# Patient Record
Sex: Female | Born: 1960 | Race: White | Hispanic: No | Marital: Single
Health system: Midwestern US, Academic
[De-identification: ages and names within clinical notes are randomized; demographics above are authoritative.]

---

## 2017-01-27 ENCOUNTER — Ambulatory Visit: Admit: 2017-01-27 | Discharge: 2017-01-28

## 2017-01-27 ENCOUNTER — Encounter: Admit: 2017-01-27 | Discharge: 2017-01-27 | Payer: BC Managed Care – HMO | Primary: Family

## 2017-01-27 DIAGNOSIS — I25119 Atherosclerotic heart disease of native coronary artery with unspecified angina pectoris: Principal | ICD-10-CM

## 2017-01-27 DIAGNOSIS — R0602 Shortness of breath: ICD-10-CM

## 2019-01-27 ENCOUNTER — Encounter: Admit: 2019-01-27 | Discharge: 2019-01-27 | Primary: Family

## 2019-01-27 DIAGNOSIS — E113513 Type 2 diabetes mellitus with proliferative diabetic retinopathy with macular edema, bilateral: Principal | ICD-10-CM

## 2019-01-27 DIAGNOSIS — E119 Type 2 diabetes mellitus without complications: Secondary | ICD-10-CM

## 2019-01-27 MED ORDER — AFLIBERCEPT 2 MG/0.05 ML INTRAVITREAL SOLN
2 mg | Freq: Once | INTRAVITREAL | 0 refills | Status: CP
Start: 2019-01-27 — End: ?
  Administered 2019-01-27: 22:00:00 2 mg via INTRAVITREAL

## 2019-01-27 NOTE — Progress Notes
There is no height or weight on file to calculate BMI.       OCT:  OD: retinal cystic spaces, erm  OS:  retinal cystic spaces, erm       I discussed diagnosis and plan for intravitreal injection with patient. Risks, benefits and alternatives were discussed with patient. Risk of infection, and signs and symptoms of infection discussed at length with patient. Discussed fluctuating and deteriorating vision over the course of treatment. Patient elects to proceed with injection, informed consent was obtained and all questions were answered.     Injection procedure:    Site:     OD  x   OS     Posterior Sub-Tenon     Subconj     Intravitreous       Pre-injection drops:   Tetracaine 0.5% drops  x    5% Betadine  x    Betadine swab to lids  x    Speculum used     Other        Injection medication:    Concentration  Volume (in mL)    Aflibercept (Eylea)  2 mg / 0.05 mL  x   Ranibizumab (Lucentis)  0.3 mg / 0.05 mL     Ranibizumab (Lucentis)  0.5 mg / 0.05 mL     Bevacizumab (Avastin)  1.25 mg / 0.05 mL     Triamcinolone (Kenalog)  40 mg / mL     Triamcinolone (Triesence)  40 mg / mL     Vancomycin  1 mg / 100 microLiters     Ceftazidime  2 mg / 100 microLiters     Kenalog Triamcinolone 40mg /ml    Ozurdex Dexamethason 0.7mg  implant      Lot #  Expiration date:     Injection Needle:   27 Gauge Needle     30 Gauge Needle  x   Needle supplied with medication       Post-operative examination:   Central retinal artery perfused by vision check X   Intraocular pressure checked by tonopen and found to be ___ mmHg     Intraocular pressure checked by applanation and found to be ___ mmHg         I personally performed the Injection           Assessment and Plan:         DM2, on insulin. Last A1c: 9% (10/2018)    1-  PDR post prp OU  Stress importance of glycemic and systemic control.  Monitor      2- DME OU  ODL: juxtafoveal, plan for ivE  OS: non center involving     3- TRD OS  Stress importance of glycemic and systemic control. Plan for fill in prp    4- PCIOL OU  Monitor     Signs and symptoms of retinal tears and retinal detachment were reviewed in details with the patient. Patient was instructed to immediately present for evaluation or seek medical help if increased flashes, increased floaters, any decrease in vision, or curtain in field of vision.    F/u 4w or sooner prn  Oct, optos

## 2019-01-28 ENCOUNTER — Encounter: Admit: 2019-01-28 | Discharge: 2019-01-28 | Primary: Family

## 2019-01-28 ENCOUNTER — Ambulatory Visit: Admit: 2019-01-27 | Discharge: 2019-01-28 | Primary: Family

## 2019-01-28 DIAGNOSIS — H3342 Traction detachment of retina, left eye: Secondary | ICD-10-CM

## 2019-01-28 DIAGNOSIS — E113593 Type 2 diabetes mellitus with proliferative diabetic retinopathy without macular edema, bilateral: Secondary | ICD-10-CM

## 2019-01-28 DIAGNOSIS — E083592 Diabetes mellitus due to underlying condition with proliferative diabetic retinopathy without macular edema, left eye: Secondary | ICD-10-CM

## 2019-01-28 DIAGNOSIS — E113592 Type 2 diabetes mellitus with proliferative diabetic retinopathy without macular edema, left eye: Secondary | ICD-10-CM

## 2019-02-04 ENCOUNTER — Encounter: Admit: 2019-02-04 | Discharge: 2019-02-04 | Primary: Family

## 2019-02-04 ENCOUNTER — Ambulatory Visit: Admit: 2019-02-04 | Discharge: 2019-02-04 | Primary: Family

## 2019-02-04 DIAGNOSIS — E119 Type 2 diabetes mellitus without complications: Secondary | ICD-10-CM

## 2019-02-04 DIAGNOSIS — Z880 Allergy status to penicillin: Secondary | ICD-10-CM

## 2019-02-04 DIAGNOSIS — Z9841 Cataract extraction status, right eye: Secondary | ICD-10-CM

## 2019-02-04 MED ORDER — TROPICAMIDE 1 % OP DROP
1 [drp] | 0 refills | Status: CP
Start: 2019-02-04 — End: ?
  Administered 2019-02-04: 17:00:00 1 [drp]

## 2019-02-04 MED ORDER — TETRACAINE HCL (PF) 0.5 % OP DROP
0 refills | Status: DC
Start: 2019-02-04 — End: 2019-02-04
  Administered 2019-02-04: 18:00:00 1 [drp] via OPHTHALMIC

## 2019-02-04 MED ORDER — PHENYLEPHRINE HCL 2.5 % OP DROP
1 [drp] | 0 refills | Status: CP
Start: 2019-02-04 — End: ?
  Administered 2019-02-04: 17:00:00 1 [drp]

## 2019-02-04 MED ORDER — CYCLOPENTOLATE 1 % OP DROP
1 [drp] | 0 refills | Status: CP
Start: 2019-02-04 — End: ?
  Administered 2019-02-04: 17:00:00 1 [drp]

## 2019-02-04 NOTE — Care Plan
Patient denies pain. Discharge instructions went over with patient.Verbalizes understanding.  Patient denies questions or concerns at this time

## 2019-02-04 NOTE — Discharge Instructions - Supplementary Instructions
[name]                            LASER  POST-OPERATIVE  INSTRUCTIONS    HOME  INSTRUCTIONS  ? Continue all the prescribed medication in your non-operative eye as usual  ? No eye patch or shield is needed  ? Resume all of your home medications today, unless instructed otherwise by your doctor  ? Resume all normal activities  ? Additional Instructions_________________________________________________________  ____________________________________________________________________________                 WHAT  TO  EXPECT  AFTER  SURGERY:  ? Your eye may feel irritated, scratchy, or like something is in it  ? Your eye may appear red or bloodshot, this will lessen as your eye heals  ? Your vision may be blurry and eyes sensitive to light following the procedure    WHAT TO WATCH / REPORT  FOR  AFTER  SURGERY:  ? Sudden decrease in vision  ? New or an increase in flashes of light or floaters  ? Sudden increase of pain or pain not relieved by over the counter pain medication                     Call the [facility] at [phone] or         at [phone] (even after hours) for questions, problems or concerns                                                                                                 Patient Signature________________________________Date_________Time____________        Nurse Signature____________________________________

## 2019-02-04 NOTE — H&P (View-Only)
Ophthalmology Preoperative History and Physical Exam - @MYYEAR @    CC/Reason for Surgery: 1-  TRD OS  2- PDR OS    HPI:   1-  TRD OS  2- PDR OS    Past Medical History:  Medical History:   Diagnosis Date   ??? DM (diabetes mellitus) (HCC)         Past Surgical History:  Surgical History:   Procedure Laterality Date   ??? CARDIAC SURGERY     ??? HX CATARACT REMOVAL Right    ??? HX HEART CATHETERIZATION          Past Ocular History:  PDR OU    Allergies:  Allergies   Allergen Reactions   ??? Penicillins UNKNOWN     As a child.        Social History:  Social History     Socioeconomic History   ??? Marital status: Single     Spouse name: Not on file   ??? Number of children: Not on file   ??? Years of education: Not on file   ??? Highest education level: Not on file   Occupational History   ??? Not on file   Tobacco Use   ??? Smoking status: Never Smoker   ??? Smokeless tobacco: Never Used   Substance and Sexual Activity   ??? Alcohol use: Not on file   ??? Drug use: Not on file   ??? Sexual activity: Not on file   Other Topics Concern   ??? Not on file   Social History Narrative   ??? Not on file        Medications:  No current facility-administered medications for this encounter.      Family History:  Family History   Problem Relation Age of Onset   ??? Blindness Neg Hx    ??? Glaucoma Neg Hx    ??? Macular Degen Neg Hx         ROS:   Constitutional: WNL   Eyes: See HPI   Ears: WNL   CV: WNL   Resp: WNL   Gastro: WNL   Musculo: WNL   Skin: WNL   Neuro: WNL     Physical Exam:  See nursing intake for vitals    General: No acute distress  HEENT: NC/AT  CV: RRR.  No murmurs/rubs/gallops detected  Resp: CTA Bilaterally  Musculoskeletal: WNL, able to lay flat    Lab Results:  CBC w/Diff    No results found for: WBC, RBC, HGB, HCT, MCV, MCH, MCHC, RDW, PLTCT, MPV No results found for: NEUT, ANC, LYMA, ALC, MONA, AMC, EOSA, AEC, BASA, ABC        Assessment:  1-  TRD OS  2- PDR OS      Plan: To laser room for PRP laser prophylaxis OS An extensive discussion took place with the patient concerning the risks, benefits and alternatives to the above procedure. The patient was given the opportunity to have all questions answered. At the conclusion of our discussion, signed informed consent was obtained.     Ronnald Collum, MBBCh   Retina and Vitreous Surgery  Department of Ophthalmology  [facility]      [facility]  [address] [place], [place] [address]  Ph:  [phone]

## 2019-02-04 NOTE — Operative Report(Direct Entry)
OPERATIVE REPORT    Name: Achol Azpeitia is a 58 y.o. 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     Specimen(s) Removed/Disposition: * No specimens in log *    Attestation: I performed this procedure without the involvement of a resident.    Complications:  None    Implants: None    Drains: None    Disposition:  PACU - stable    Nataliah Hatlestad, MBBCh  Pager

## 2019-02-06 ENCOUNTER — Encounter: Admit: 2019-02-06 | Discharge: 2019-02-06 | Primary: Family

## 2019-02-06 DIAGNOSIS — E119 Type 2 diabetes mellitus without complications: Secondary | ICD-10-CM

## 2019-02-25 ENCOUNTER — Encounter: Admit: 2019-02-25 | Discharge: 2019-02-25 | Primary: Family

## 2019-02-25 DIAGNOSIS — E113513 Type 2 diabetes mellitus with proliferative diabetic retinopathy with macular edema, bilateral: Principal | ICD-10-CM

## 2019-02-25 MED ORDER — AFLIBERCEPT 2 MG/0.05 ML INTRAVITREAL SOLN
2 mg | Freq: Once | INTRAVITREAL | 0 refills | Status: CP
Start: 2019-02-25 — End: ?
  Administered 2019-02-25: 15:00:00 2 mg via INTRAVITREAL

## 2019-02-25 NOTE — Progress Notes
There is no height or weight on file to calculate BMI.       OCT:  OD: mild increase retinal cystic spaces, erm  OS: retinal cystic spaces, erm       I discussed diagnosis and plan for intravitreal injection with patient. Risks, benefits and alternatives were discussed with patient. Risk of infection, and signs and symptoms of infection discussed at length with patient. Discussed fluctuating and deteriorating vision over the course of treatment. Patient elects to proceed with injection, informed consent was obtained and all questions were answered.     Injection procedure:    Site:     OD  x   OS     Posterior Sub-Tenon     Subconj     Intravitreous       Pre-injection drops:   Tetracaine 0.5% drops  x    5% Betadine  x    Betadine swab to lids  x    Speculum used     Other        Injection medication:    Concentration  Volume (in mL)    Aflibercept (Eylea)  2 mg / 0.05 mL  x   Ranibizumab (Lucentis)  0.3 mg / 0.05 mL     Ranibizumab (Lucentis)  0.5 mg / 0.05 mL     Bevacizumab (Avastin)  1.25 mg / 0.05 mL     Triamcinolone (Kenalog)  40 mg / mL     Triamcinolone (Triesence)  40 mg / mL     Vancomycin  1 mg / 100 microLiters     Ceftazidime  2 mg / 100 microLiters     Kenalog Triamcinolone 40mg /ml    Ozurdex Dexamethason 0.7mg  implant      Lot #  Expiration date:     Injection Needle:   27 Gauge Needle     30 Gauge Needle  x   Needle supplied with medication       Post-operative examination:   Central retinal artery perfused by vision check X   Intraocular pressure checked by tonopen and found to be ___ mmHg     Intraocular pressure checked by applanation and found to be ___ mmHg         I personally performed the Injection           Assessment and Plan:         DM2, on insulin. Last A1c: 9% (10/2018)  ???  1-  PDR post prp OU  Stress importance of glycemic and systemic control.  Monitor    ???  2- DME OU  ODL: juxtafoveal, plan for ivE  OS: non center involving   ???  3- TRD OS Stress importance of glycemic and systemic control.  Plan for fill in prp  ???  4- PCIOL OU  Monitor     5- ERM OU  Asymptomatic   monitor   ???  Signs and symptoms of retinal tears and retinal detachment were reviewed in details with the patient. Patient was instructed to immediately present for evaluation or seek medical help if increased flashes, increased floaters, any decrease in vision, or curtain in field of vision.  ???  F/u 4w or sooner prn  Oct, optos  ???

## 2019-02-26 ENCOUNTER — Ambulatory Visit: Admit: 2019-02-25 | Discharge: 2019-02-26 | Primary: Family

## 2019-03-03 ENCOUNTER — Encounter: Admit: 2019-03-03 | Discharge: 2019-03-03 | Primary: Family

## 2019-03-03 NOTE — Telephone Encounter
03/03/19 called and left message for pt to call back with records information. Need previous cardiac care info and verify pcp info edh

## 2019-03-11 ENCOUNTER — Encounter: Admit: 2019-03-11 | Discharge: 2019-03-11 | Primary: Family

## 2019-03-17 ENCOUNTER — Encounter: Admit: 2019-03-17 | Discharge: 2019-03-17 | Primary: Family

## 2019-03-17 ENCOUNTER — Ambulatory Visit: Admit: 2019-03-17 | Discharge: 2019-03-18 | Primary: Family

## 2019-03-17 DIAGNOSIS — E119 Type 2 diabetes mellitus without complications: Secondary | ICD-10-CM

## 2019-03-17 DIAGNOSIS — I5042 Chronic combined systolic (congestive) and diastolic (congestive) heart failure: Secondary | ICD-10-CM

## 2019-03-17 DIAGNOSIS — E785 Hyperlipidemia, unspecified: Secondary | ICD-10-CM

## 2019-03-17 DIAGNOSIS — I2510 Atherosclerotic heart disease of native coronary artery without angina pectoris: Secondary | ICD-10-CM

## 2019-03-17 DIAGNOSIS — Z9889 Other specified postprocedural states: Secondary | ICD-10-CM

## 2019-03-17 DIAGNOSIS — I509 Heart failure, unspecified: Secondary | ICD-10-CM

## 2019-03-17 DIAGNOSIS — I5032 Chronic diastolic (congestive) heart failure: Secondary | ICD-10-CM

## 2019-03-17 DIAGNOSIS — I255 Ischemic cardiomyopathy: Secondary | ICD-10-CM

## 2019-03-17 NOTE — Assessment & Plan Note
Left ventricular systolic function improved to an ejection fraction of 60% by echocardiography in March.  She does not currently have symptoms heart failure.

## 2019-03-17 NOTE — Progress Notes
Records Request  STAT REQUEST FOR MOST RECENT LABS    Medical records request for continuation of care:    Patient has appointment on Now with Dr. Ricard Dillon.     Please fax records to [facility]  [phone]    Request records:        Recent Labs        Other      Thank you,      [facility]  [facility]  [address] [place] [address]  Phone:  [phone]  Fax:  [phone]

## 2019-03-17 NOTE — Assessment & Plan Note
Breathlessness is her cardiac ischemia symptoms.  She is doing well from a symptom standpoint currently.  She does not need to be on Plavix because she did not get a coronary stent during the catheterization in March.

## 2019-03-17 NOTE — Assessment & Plan Note
I requested her most lipid levels.

## 2019-03-17 NOTE — Progress Notes
Date of Service: 03/17/2019    Debra Kennedy is a 58 y.o. female.       HPI     [facility]ra [facility] was in the [facility] today to establish cardiology care.  She lives in [place] him and has [place].  She has been seeing Dr. Wyvonnia Lora in the [facility] but because he is based out of [place] he suggested that she transfer her care to the [facility] in the eventuality that she requires hospitalization.    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  She has been off work for about 6 months now and is looking forward to getting back to work later this month.    She was diagnosed with type 1 diabetes about 10 years ago.  She presented with breathlessness in 2017 and was found to have multivessel coronary disease which led to a bypass [facility]  She had a vein graft to the circumflex and left internal mammary to the LAD.  Initially she had severe left ventricular systolic dysfunction but with medical therapy she has had steady improvement and her most recent ejection fraction by echocardiography was in March, 55 to 60%.    She is never had typical angina symptoms and this remains the case.  In March she had problems with progressive breathlessness and underwent coronary arteriography at [facility].  At that time the vein graft to the circumflex was found to be occluded and an attempt to perform PCI on a totally occluded circumflex was ultimately unsuccessful.  She was discharged on Plavix in addition to aspirin at that point.    She is done pretty well since then.  She says that her blood pressure has been good, her weight has been stable, and she has not had any particular problems with exertional breathlessness.    She sees her nurse practitioner her primary provider at the [facility] on a monthly basis.  She says that her most recent hemoglobin A1c was down to 7.3%.  She thinks that she had a lipid profile recently and says that she has been on atorvastatin for about 3 years. She denies any problems with palpitations, syncope, or near syncope.  She denies TIA or stroke symptoms.       Vitals:    03/17/19 1338 03/17/19 1359   BP: 116/82 114/72   BP Source: Arm, Left Upper Arm, Right Upper   Pulse: 88    Temp: 36.7 ???C (98 ???F)    SpO2: 98%    Weight: 101.6 kg (224 lb)    Height: 1.6 m (5' 3)    PainSc: Zero      Body mass index is 39.68 kg/m???.     Past Medical History  Patient Active Problem List    Diagnosis Date Noted   ??? Coronary artery disease 03/17/2019     11/09/2018  Coronary angiogram:  Balloon angioplasty of the proximal and mid to distal left circumflex into a small obtuse marginal branch without any significant improvement in flow in the obtuse marginal branch.  Occluded LAD, occluded RCA.  Saphenous vein graft to obtuse marginal branches is occluded.  Widely patent LIMA to the LAD.     03/11/2016 [facility] CABG x 2 vessels           ??? Ischemic cardiomyopathy 03/17/2019     10/21/2018  Echo:  Normal left ventricular size.  LV EF 55-60%.  Normal LV wall thickness. LV diastolic parameters were normal.  There is mild to mod mitral valve regurgitation.  Mild mitral annular calcification.  Mitral valve leaflets appear mildly thickened.  Tricuspid valve not well visualized.  Right ventricular systolic pressure could not be determined due to the lack of a tricuspid regurgitation doppler signal. No significant pericardial effusion.  The IVC diameter was 11 mm. The inferior vena cava shows a normal respiratory collapse consistent with normal right atrial pressure ( ).  05/21/2017  Echo: Normal left ventricular size.  Mod depressed left ventricular systolic function.  EF 40%.  Normal LV wall thickness.  Normal pericardium.  IVC diameter was 13 mm.        ??? Congestive heart failure (HCC) 03/17/2019   ??? Dyslipidemia 03/17/2019   ??? Diabetes mellitus (HCC) 03/17/2019   ??? 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   HENT: Negative.    Eyes: Negative.    Cardiovascular: Positive for dyspnea on exertion.   Respiratory: Positive for cough.    Endocrine: Negative.    Hematologic/Lymphatic: Bruises/bleeds easily.   Skin: Negative.    Musculoskeletal: Positive for back pain.   Gastrointestinal: Negative.    Genitourinary: Negative.    Neurological: Negative.    Psychiatric/Behavioral: The patient has insomnia.    Allergic/Immunologic: Negative.        Physical Exam    Physical Exam   General Appearance: no distress   Skin: warm, no ulcers or xanthomas   Digits and Nails: no cyanosis or clubbing   Eyes: conjunctivae and lids normal, pupils are equal and round   Teeth/Gums/Palate: dentition unremarkable, no lesions   Lips & Oral Mucosa: no pallor or cyanosis   Neck Veins: normal JVP , neck veins are not distended   Thyroid: no nodules, masses, tenderness or enlargement   Chest Inspection: chest is normal in appearance   Respiratory Effort: breathing comfortably, no respiratory distress   Auscultation/Percussion: lungs clear to auscultation, no rales or rhonchi, no wheezing   PMI: PMI not enlarged or displaced   Cardiac Rhythm: regular rhythm and normal rate   Cardiac Auscultation: S1, S2 normal, no rub, no gallop   Murmurs: no murmur   Peripheral Circulation: normal peripheral circulation   Carotid Arteries: normal carotid upstroke bilaterally, no bruits   Radial Arteries: normal symmetric radial pulses   Abdominal Aorta: no abdominal aortic bruit   Pedal Pulses: normal symmetric pedal pulses   Lower Extremity Edema: no lower extremity edema   Abdominal Exam: soft, non-tender, no masses, bowel sounds normal   Liver & Spleen: no organomegaly   Gait & Station: walks without assistance   Muscle Strength: normal muscle tone   Orientation: oriented to time, place and person   Affect & Mood: appropriate and sustained affect   Language and Memory: patient responsive and seems to comprehend information Neurologic Exam: neurological assessment grossly intact   Other: moves all extremities      Cardiovascular Studies    EKG:  Sinus rhythm, rate 82.  Normal EKG.    Problems Addressed Today  Encounter Diagnoses   Name Primary?   ??? Chronic diastolic heart failure (HCC) Yes   ??? Status post ligation of left atrial appendage    ??? Coronary artery disease due to calcified coronary lesion    ??? Chronic combined systolic and diastolic congestive heart failure (HCC)    ??? Dyslipidemia  Assessment and Plan       Coronary artery disease  Breathlessness is her cardiac ischemia symptoms.  She is doing well from a symptom standpoint currently.  She does not need to be on Plavix because she did not get a coronary stent during the catheterization in March.    Congestive heart failure (HCC)  Left ventricular systolic function improved to an ejection fraction of 60% by echocardiography in March.  She does not currently have symptoms heart failure.    Dyslipidemia  I requested her most lipid levels.      Current Medications (including today's revisions)  ??? aspirin EC 81 mg tablet Take 1 tablet by mouth daily.   ??? atorvastatin (LIPITOR) 80 mg tablet Take 1 tablet by mouth daily.   ??? fluticasone propionate (FLONASE) 50 mcg/actuation nasal spray, suspension Apply 1 spray to each nostril as directed as Needed.   ??? furosemide (LASIX) 20 mg tablet Take 1 tablet by mouth every 48 hours. Alternating with 40mg  every other day   ??? furosemide (LASIX) 40 mg tablet ALTERNATE BETWEEN 1 TABLET AND 1 2 TABLET BY MOUTH EVERY OTHER DAY   ??? glipiZIDE CR (GLUCOTROL XL) 10 mg tablet Take 10 mg by mouth daily with breakfast.   ??? insulin degludec (TRESIBA FLEXTOUCH U-100) 100 unit/mL (3 mL) injection pen Inject 30 Units under the skin every morning.   ??? isosorbide mononitrate SR (IMDUR) 30 mg tablet Take 30 mg by mouth daily.   ??? JARDIANCE 25 mg tablet Take 25 mg by mouth daily.   ??? lisinopriL (ZESTRIL) 5 mg tablet Take 5 mg by mouth daily. ??? melatonin 10 mg tab Take 1 tablet by mouth at bedtime as needed.   ??? metFORMIN (GLUCOPHAGE) 1,000 mg tablet Take 1,000 mg by mouth twice daily with meals.   ??? metoprolol XL (TOPROL XL) 50 mg extended release tablet Take 1 tablet by mouth daily.   ??? nitroglycerin (NITROSTAT) 0.4 mg tablet DISSOLVE ONE TABLET UNDER THE TONGUE EVERY 5 MINUTES AS NEEDED FOR CHEST PAIN. CALL 911. DO NOT EXCEED A TOTAL OF 3 DOSES IN 15 MINUTES NOW   ??? omeprazole DR (PRILOSEC) 20 mg capsule Take 20 mg by mouth daily before breakfast.   ??? potassium chloride SR (K-DUR) 20 mEq tablet Take 20 mEq by mouth daily.   ??? PROAIR RESPICLICK 90 mcg/actuation aerosol inhaler INHALE 1 PUFF BY MOUTH EVERY 4 TO 6 HOURS AS NEEDED

## 2019-06-15 ENCOUNTER — Encounter: Admit: 2019-06-15 | Discharge: 2019-06-15 | Payer: BC Managed Care – HMO | Primary: Family

## 2019-06-15 NOTE — Progress Notes
Records Request    Medical records request for continuation of care:    Patient has appointment on 06/21/2019  with  Dr. Tyson Alias* .    Please fax records to [facility]    Request records: STAT          Recent Labs (Most recent CMP, Lipid Panel)            Thank you,      Cardiovascular Medicine  [facility]  [address] [place] [address]  Phone:  [phone]  Fax:  [phone]

## 2019-06-21 ENCOUNTER — Encounter: Admit: 2019-06-21 | Discharge: 2019-06-21 | Payer: BC Managed Care – HMO | Primary: Family

## 2019-06-21 DIAGNOSIS — I2510 Atherosclerotic heart disease of native coronary artery without angina pectoris: Secondary | ICD-10-CM

## 2019-06-21 DIAGNOSIS — E1059 Type 1 diabetes mellitus with other circulatory complications: Secondary | ICD-10-CM

## 2019-06-21 DIAGNOSIS — E785 Hyperlipidemia, unspecified: Secondary | ICD-10-CM

## 2019-06-21 DIAGNOSIS — I255 Ischemic cardiomyopathy: Secondary | ICD-10-CM

## 2019-06-21 NOTE — Progress Notes
Date of Service: 06/21/2019    Debra Kennedy is a 58 y.o. female.       HPI     Debra Kennedy was in the [place] [facility] today for follow-up regarding coronary disease.  I had initially seen her in August.  She is done reasonably well over the past couple of months and is very happy with the gradual decline in her hemoglobin A1c levels.    She has had no trouble with palpitations, syncope, or near syncope.  She denies any anginal chest discomfort or exertional dyspnea.         Vitals:    06/21/19 1541 06/21/19 1554   BP: 128/84 124/78   BP Source: Arm, Left Upper Arm, Right Upper   Pulse: 74    Temp: 36.7 ?C (98.1 ?F)    SpO2: 98%    Weight: 99.6 kg (219 lb 9.6 oz)    Height: 1.6 m (5' 3)    PainSc: Zero      Body mass index is 38.9 kg/m?Marland Kitchen     Past Medical History  Patient Active Problem List    Diagnosis Date Noted   ? Coronary artery disease 03/17/2019     11/09/2018  Coronary angiogram:  Balloon angioplasty of the proximal and mid to distal left circumflex into a small obtuse marginal branch without any significant improvement in flow in the obtuse marginal branch.  Occluded LAD, occluded RCA.  Saphenous vein graft to obtuse marginal branches is occluded.  Widely patent LIMA to the LAD.     03/11/2016 [facility] CABG x 2 vessels           ? Ischemic cardiomyopathy 03/17/2019     10/21/2018  Echo:  Normal left ventricular size.  LV EF 55-60%.  Normal LV wall thickness. LV diastolic parameters were normal.  There is mild to mod mitral valve regurgitation.  Mild mitral annular calcification.  Mitral valve leaflets appear mildly thickened.  Tricuspid valve not well visualized.  Right ventricular systolic pressure could not be determined due to the lack of a tricuspid regurgitation doppler signal. No significant pericardial effusion.  The IVC diameter was 11 mm. The inferior vena cava shows a normal respiratory collapse consistent with normal right atrial pressure ( ). 05/21/2017  Echo: Normal left ventricular size.  Mod depressed left ventricular systolic function.  EF 40%.  Normal LV wall thickness.  Normal pericardium.  IVC diameter was 13 mm.        ? Congestive heart failure (HCC) 03/17/2019   ? Dyslipidemia 03/17/2019   ? Diabetes mellitus (HCC) 03/17/2019   ? 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   HENT: Negative.    Eyes: Negative.    Cardiovascular: Positive for dyspnea on exertion.   Respiratory: Positive for snoring.    Endocrine: Negative.    Hematologic/Lymphatic: Negative.    Skin: Negative.    Musculoskeletal: Negative.    Gastrointestinal: Negative.    Genitourinary: Negative.    Neurological: Positive for light-headedness, numbness and paresthesias.   Psychiatric/Behavioral: Positive for depression. The patient is nervous/anxious.    Allergic/Immunologic: Negative.        Physical Exam    Physical Exam   General Appearance: no distress   Skin: warm, no ulcers or xanthomas   Digits and Nails: no cyanosis or clubbing   Eyes: conjunctivae and lids normal,  pupils are equal and round   Teeth/Gums/Palate: dentition unremarkable, no lesions   Lips & Oral Mucosa: no pallor or cyanosis   Neck Veins: normal JVP , neck veins are not distended   Thyroid: no nodules, masses, tenderness or enlargement   Chest Inspection: chest is normal in appearance   Respiratory Effort: breathing comfortably, no respiratory distress   Auscultation/Percussion: lungs clear to auscultation, no rales or rhonchi, no wheezing   PMI: PMI not enlarged or displaced   Cardiac Rhythm: regular rhythm and normal rate   Cardiac Auscultation: S1, S2 normal, no rub, no gallop   Murmurs: no murmur   Peripheral Circulation: normal peripheral circulation   Carotid Arteries: normal carotid upstroke bilaterally, no bruits   Radial Arteries: normal symmetric radial pulses Abdominal Aorta: no abdominal aortic bruit   Pedal Pulses: normal symmetric pedal pulses   Lower Extremity Edema: no lower extremity edema   Abdominal Exam: soft, non-tender, no masses, bowel sounds normal   Liver & Spleen: no organomegaly   Gait & Station: walks without assistance   Muscle Strength: normal muscle tone   Orientation: oriented to time, place and person   Affect & Mood: appropriate and sustained affect   Language and Memory: patient responsive and seems to comprehend information   Neurologic Exam: neurological assessment grossly intact   Other: moves all extremities        Problems Addressed Today  No diagnosis found.    Assessment and Plan     Coronary artery disease  Breathlessness is her cardiac ischemia symptoms.  She is doing well from a symptom standpoint currently.    ?  Congestive heart failure (HCC)  Left ventricular systolic function improved to an ejection fraction of 60% by echocardiography in March.  She does not currently have symptoms heart failure.  ?         Current Medications (including today's revisions)  ? aspirin EC 81 mg tablet Take 1 tablet by mouth daily.   ? atorvastatin (LIPITOR) 80 mg tablet Take 1 tablet by mouth daily.   ? FLUoxetine (PROZAC) 20 mg capsule Take 20 mg by mouth daily.   ? fluticasone propionate (FLONASE) 50 mcg/actuation nasal spray, suspension Apply 1 spray to each nostril as directed as Needed.   ? furosemide (LASIX) 20 mg tablet Take 1 tablet by mouth every 48 hours. Alternating with 40mg  every other day   ? furosemide (LASIX) 40 mg tablet Take 40 mg by mouth every 48 hours. Alternating with 20mg  every other day   ? glipiZIDE CR (GLUCOTROL XL) 10 mg tablet Take 10 mg by mouth daily with breakfast.   ? insulin degludec (TRESIBA FLEXTOUCH U-100) 100 unit/mL (3 mL) injection pen Inject 30 Units under the skin every morning.   ? isosorbide mononitrate SR (IMDUR) 30 mg tablet Take 30 mg by mouth daily.   ? JARDIANCE 10 mg tablet Take 20 mg by mouth daily. ? lisinopriL (ZESTRIL) 5 mg tablet Take 5 mg by mouth daily.   ? melatonin 10 mg tab Take 1 tablet by mouth at bedtime as needed.   ? metFORMIN (GLUCOPHAGE) 1,000 mg tablet Take 1,000 mg by mouth twice daily with meals.   ? metoprolol XL (TOPROL XL) 50 mg extended release tablet Take 1 tablet by mouth daily.   ? nitroglycerin (NITROSTAT) 0.4 mg tablet DISSOLVE ONE TABLET UNDER THE TONGUE EVERY 5 MINUTES AS NEEDED FOR CHEST PAIN. CALL 911. DO NOT EXCEED A TOTAL OF 3 DOSES IN 15 MINUTES NOW   ?  omeprazole DR (PRILOSEC) 20 mg capsule Take 20 mg by mouth daily before breakfast.   ? potassium chloride SR (K-DUR) 20 mEq tablet Take 20 mEq by mouth daily.   ? PROAIR RESPICLICK 90 mcg/actuation aerosol inhaler INHALE 1 PUFF BY MOUTH EVERY 4 TO 6 HOURS AS NEEDED

## 2022-03-07 ENCOUNTER — Encounter: Admit: 2022-03-07 | Discharge: 2022-03-07 | Payer: BC Managed Care – HMO | Primary: Family

## 2023-02-27 ENCOUNTER — Encounter: Admit: 2023-02-27 | Discharge: 2023-02-27 | Payer: Commercial Managed Care - HMO | Primary: Family

## 2024-03-28 ENCOUNTER — Encounter: Admit: 2024-03-28 | Discharge: 2024-03-28 | Payer: PRIVATE HEALTH INSURANCE | Primary: Family

## 2024-03-30 ENCOUNTER — Encounter: Admit: 2024-03-30 | Discharge: 2024-03-30 | Payer: PRIVATE HEALTH INSURANCE | Primary: Family

## 2024-03-30 NOTE — Telephone Encounter
 03/30/2024 Records request faxed to Lauraine Moody, APRN [phone] per WQ task below. Record from Dr. Herlene may be accessed through CE. sdc    Herlene, Millard PARAS, MD, [facility]  Cardiology Services at [facility]  [address]  [address]  [place], [place] [address]  [phone]   [address] [place], [place] [id number]  Phone: [phone]

## 2024-03-30 NOTE — Telephone Encounter
 03/30/2024 - Records received from [facility], APRN have been scanned into chart/bmw

## 2024-04-20 ENCOUNTER — Encounter: Admit: 2024-04-20 | Discharge: 2024-04-20 | Primary: Family

## 2024-05-19 ENCOUNTER — Encounter: Admit: 2024-05-19 | Discharge: 2024-05-19 | Payer: MEDICARE | Primary: Family

## 2024-07-15 IMAGING — CT BRAIN WO(Adult)
3 of 4 series · 14 of 47 positions shown, 16 images · non-contrast
Comparison: none

[Series 4: brain cor 5.00 hr40 s3 · coronal · 0.32mm/px · 3 of 46 slices shown]
[im 16/46  brain]
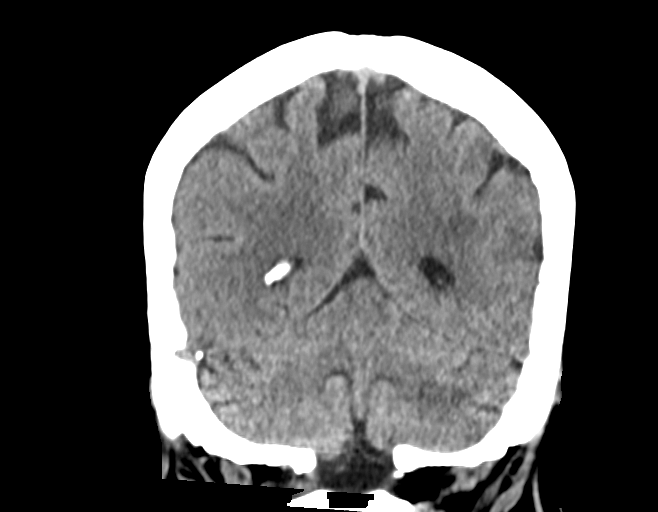
[im 21/46  brain]
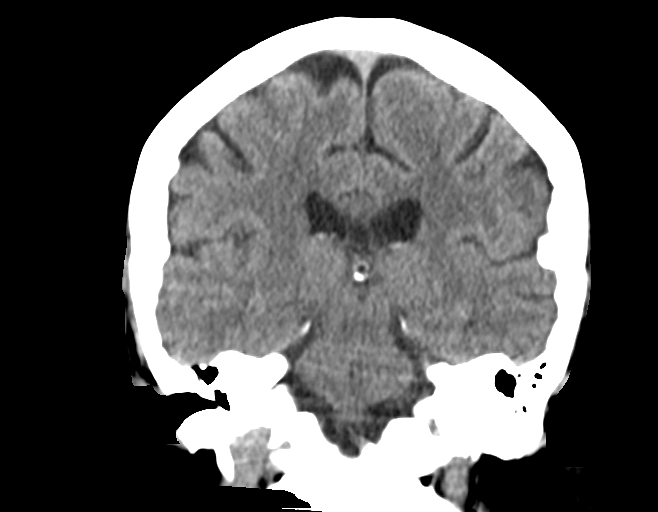
[im 26/46  brain]
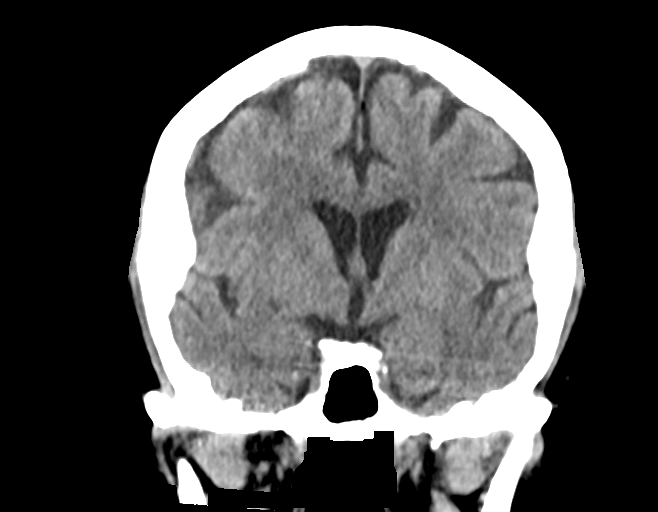

[Series 6: brain sag 5.00 hr40 s3 · sagittal · 0.32mm/px · 3 of 41 slices shown]
[im 14/41  brain]
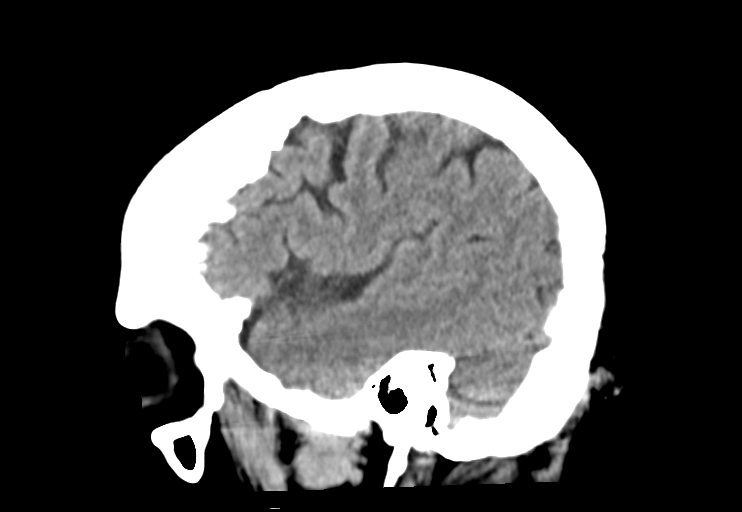
[im 21/41  brain]
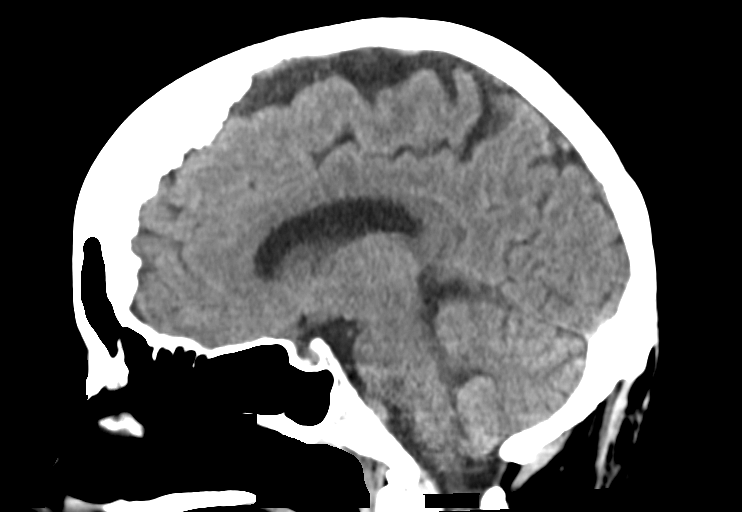
[im 27/41  brain]
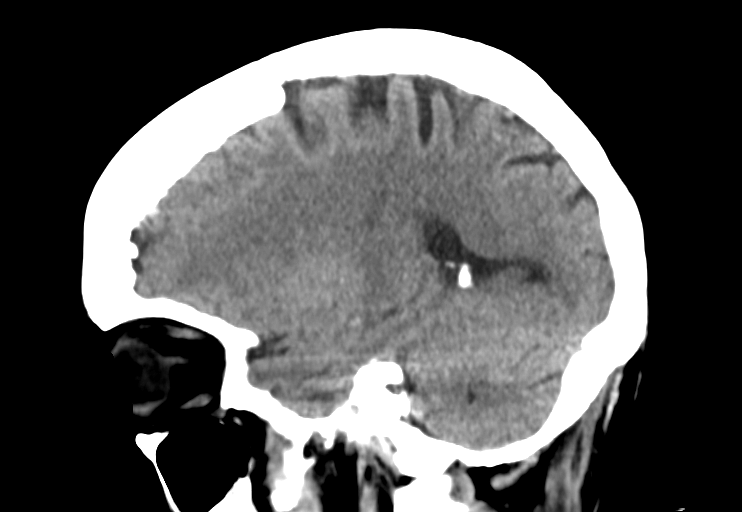

[Series 8: brain ax 2.00 hr60 s3 · axial · 0.40mm/px · z∈[-525,-397]mm · 8 of 80 slices shown, 10 images]
[im 8/80  brain]
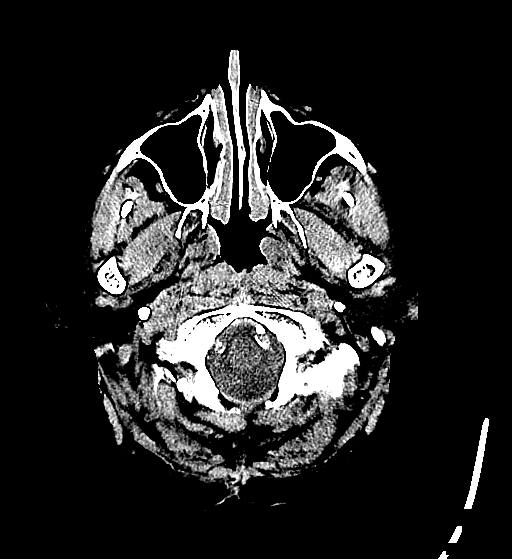
[im 8/80  bone]
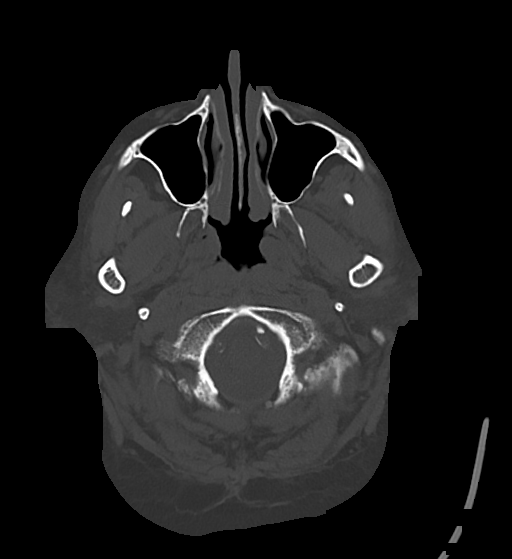
[im 16/80  brain]
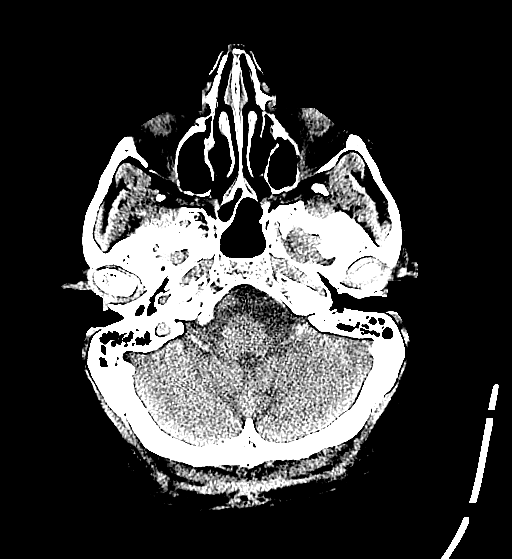
[im 24/80  brain]
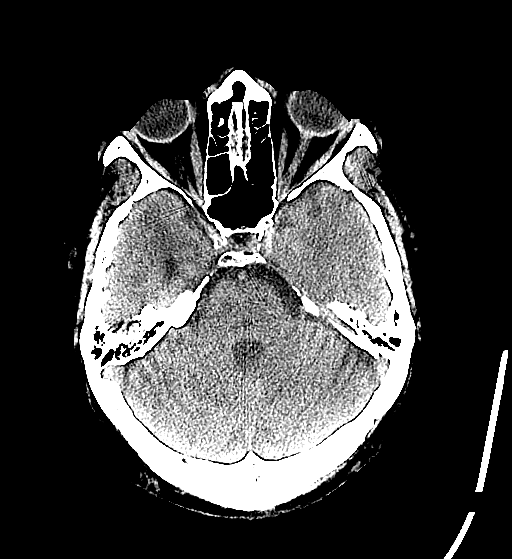
[im 36/80  brain]
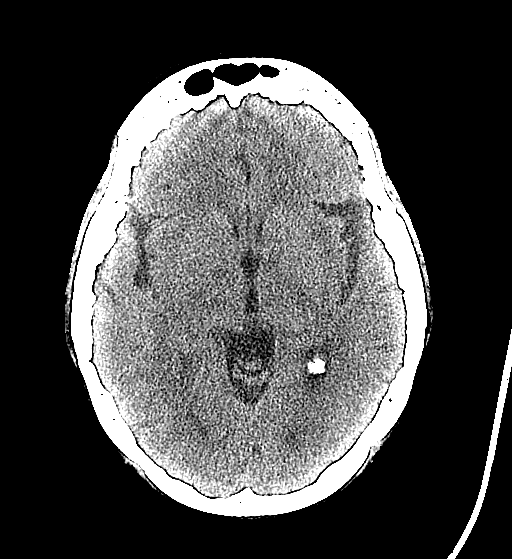
[im 44/80  brain]
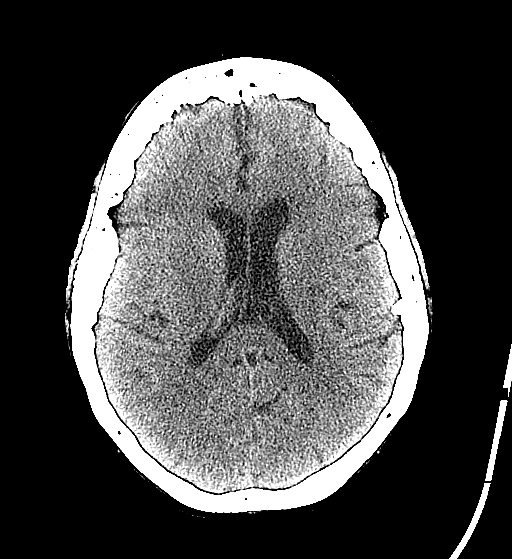
[im 44/80  bone]
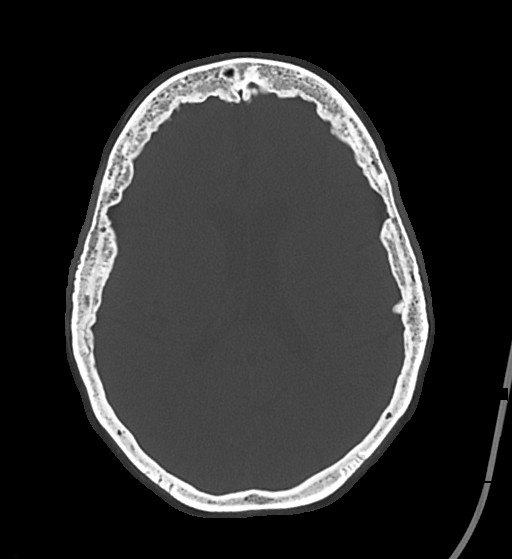
[im 56/80  brain]
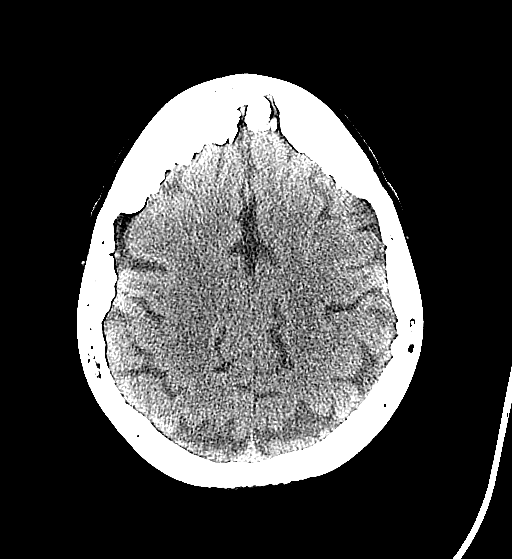
[im 64/80  brain]
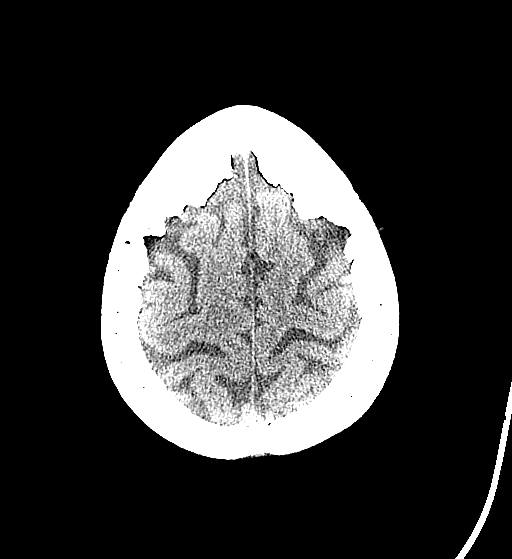
[im 72/80  brain]
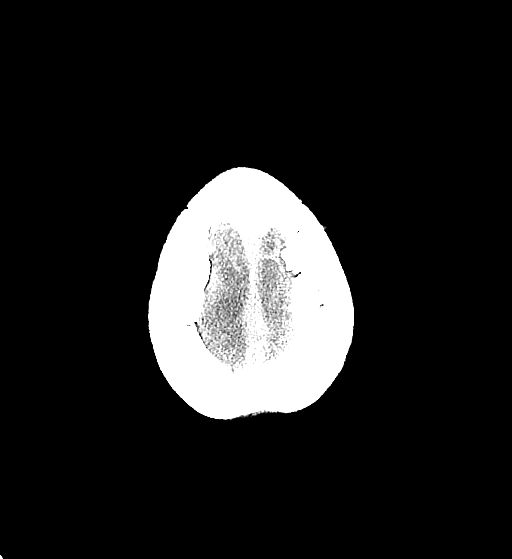

[14 of 47 positions shown; findings below may reference images not displayed]

EXAM

Head CT.

INDICATION

anesthesia of skin,left side weakness,paresthesia
for about a month pt states numbness/tingling on left side. started in left side. now she feels
tingling from head to toe on left side

TECHNIQUE

Noncontrast CT scan of the head with coronal and sagittal reformations. One prior CT scan and no
myocardial perfusion scans in the past year.

COMPARISONS

January 31, 2022 head CT.

FINDINGS

There is no intra-axial or extra-axial hemorrhage. The lateral ventricles are symmetric in size and
are not substantially altered in appearance since the prior examination. Hyperostosis frontalis is
re-demonstrated. Right cataract surgery. The paranasal sinuses and mastoid air cells are clear.
Intracranial vascular calcifications are present. Mild low attenuation in the centrum semiovale,
left greater than right, not significantly changed.

IMPRESSION

No evidence of acute intracranial pathology. No significant change.

Tech Notes:

for about a month pt states numbness/tingling on left side. started in left side. now she feels
tingling from head to toe on left side

## 2024-08-01 ENCOUNTER — Encounter: Admit: 2024-08-01 | Discharge: 2024-08-01 | Payer: MEDICARE | Primary: Family

## 2024-09-08 ENCOUNTER — Encounter: Admit: 2024-09-08 | Discharge: 2024-09-08 | Payer: MEDICARE | Primary: Family
